# Patient Record
Sex: Male | Born: 1990 | Hispanic: Yes | Marital: Single | Smoking: Current some day smoker
Health system: Southern US, Community
[De-identification: ages and names within clinical notes are randomized; demographics above are authoritative.]

## PROBLEM LIST (undated history)

## (undated) DIAGNOSIS — K219 Gastro-esophageal reflux disease without esophagitis: Secondary | ICD-10-CM

## (undated) DIAGNOSIS — A048 Other specified bacterial intestinal infections: Secondary | ICD-10-CM

## (undated) HISTORY — PX: KNEE SURGERY: SHX244

---
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
## 2009-09-09 ENCOUNTER — Emergency Department: Payer: Self-pay | Admitting: Emergency Medicine

## 2012-09-17 ENCOUNTER — Emergency Department: Payer: Self-pay | Admitting: Emergency Medicine

## 2013-08-25 ENCOUNTER — Emergency Department: Payer: Self-pay

## 2013-08-25 LAB — URINALYSIS, COMPLETE
BACTERIA: NONE SEEN
BILIRUBIN, UR: NEGATIVE
Blood: NEGATIVE
GLUCOSE, UR: NEGATIVE mg/dL (ref 0–75)
Ketone: NEGATIVE
LEUKOCYTE ESTERASE: NEGATIVE
Nitrite: NEGATIVE
PH: 5 (ref 4.5–8.0)
Protein: NEGATIVE
RBC,UR: NONE SEEN /HPF (ref 0–5)
SPECIFIC GRAVITY: 1.025 (ref 1.003–1.030)
WBC UR: NONE SEEN /HPF (ref 0–5)

## 2013-08-25 LAB — CBC WITH DIFFERENTIAL/PLATELET
BASOS ABS: 0.1 10*3/uL (ref 0.0–0.1)
Basophil %: 0.8 %
EOS ABS: 0.3 10*3/uL (ref 0.0–0.7)
EOS PCT: 2.8 %
HCT: 45.5 % (ref 40.0–52.0)
HGB: 15.5 g/dL (ref 13.0–18.0)
LYMPHS ABS: 3.7 10*3/uL — AB (ref 1.0–3.6)
LYMPHS PCT: 39.7 %
MCH: 28.3 pg (ref 26.0–34.0)
MCHC: 34.1 g/dL (ref 32.0–36.0)
MCV: 83 fL (ref 80–100)
Monocyte #: 0.5 x10 3/mm (ref 0.2–1.0)
Monocyte %: 5.7 %
Neutrophil #: 4.7 10*3/uL (ref 1.4–6.5)
Neutrophil %: 51 %
PLATELETS: 271 10*3/uL (ref 150–440)
RBC: 5.47 10*6/uL (ref 4.40–5.90)
RDW: 14 % (ref 11.5–14.5)
WBC: 9.2 10*3/uL (ref 3.8–10.6)

## 2013-08-25 LAB — COMPREHENSIVE METABOLIC PANEL
ALT: 27 U/L (ref 12–78)
AST: 25 U/L (ref 15–37)
Albumin: 4.1 g/dL (ref 3.4–5.0)
Alkaline Phosphatase: 94 U/L
Anion Gap: 5 — ABNORMAL LOW (ref 7–16)
BUN: 13 mg/dL (ref 7–18)
Bilirubin,Total: 0.3 mg/dL (ref 0.2–1.0)
CHLORIDE: 106 mmol/L (ref 98–107)
Calcium, Total: 9.2 mg/dL (ref 8.5–10.1)
Co2: 28 mmol/L (ref 21–32)
Creatinine: 1.03 mg/dL (ref 0.60–1.30)
EGFR (Non-African Amer.): >60
GLUCOSE: 99 mg/dL (ref 65–99)
OSMOLALITY: 278 (ref 275–301)
POTASSIUM: 3.4 mmol/L — AB (ref 3.5–5.1)
SODIUM: 139 mmol/L (ref 136–145)
TOTAL PROTEIN: 8 g/dL (ref 6.4–8.2)

## 2013-08-25 LAB — LIPASE, BLOOD: LIPASE: 123 U/L (ref 73–393)

## 2013-08-26 ENCOUNTER — Emergency Department: Payer: Self-pay | Admitting: Emergency Medicine

## 2013-08-31 ENCOUNTER — Ambulatory Visit: Payer: Self-pay | Admitting: Physician Assistant

## 2013-08-31 LAB — AMYLASE: Amylase: 50 U/L (ref 25–115)

## 2013-10-30 ENCOUNTER — Emergency Department: Payer: Self-pay | Admitting: Emergency Medicine

## 2014-10-21 ENCOUNTER — Emergency Department
Admission: EM | Admit: 2014-10-21 | Discharge: 2014-10-21 | Disposition: A | Discharge status: Home / Self Care | Payer: Self-pay | Attending: Emergency Medicine | Admitting: Emergency Medicine

## 2014-10-21 ENCOUNTER — Encounter: Payer: Self-pay | Admitting: Emergency Medicine

## 2014-10-21 ENCOUNTER — Emergency Department: Payer: Self-pay

## 2014-10-21 DIAGNOSIS — Z720 Tobacco use: Secondary | ICD-10-CM | POA: Insufficient documentation

## 2014-10-21 DIAGNOSIS — Y9289 Other specified places as the place of occurrence of the external cause: Secondary | ICD-10-CM | POA: Insufficient documentation

## 2014-10-21 DIAGNOSIS — S82041A Displaced comminuted fracture of right patella, initial encounter for closed fracture: Secondary | ICD-10-CM | POA: Insufficient documentation

## 2014-10-21 DIAGNOSIS — Y9389 Activity, other specified: Secondary | ICD-10-CM | POA: Insufficient documentation

## 2014-10-21 DIAGNOSIS — S82001A Unspecified fracture of right patella, initial encounter for closed fracture: Secondary | ICD-10-CM

## 2014-10-21 DIAGNOSIS — S82002A Unspecified fracture of left patella, initial encounter for closed fracture: Secondary | ICD-10-CM | POA: Insufficient documentation

## 2014-10-21 DIAGNOSIS — W1789XA Other fall from one level to another, initial encounter: Secondary | ICD-10-CM | POA: Insufficient documentation

## 2014-10-21 DIAGNOSIS — Z23 Encounter for immunization: Secondary | ICD-10-CM | POA: Insufficient documentation

## 2014-10-21 DIAGNOSIS — Y998 Other external cause status: Secondary | ICD-10-CM | POA: Insufficient documentation

## 2014-10-21 MED ORDER — TETANUS-DIPHTH-ACELL PERTUSSIS 5-2.5-18.5 LF-MCG/0.5 IM SUSP
INTRAMUSCULAR | Status: AC
  Filled 2014-10-21: qty 0.5

## 2014-10-21 MED ORDER — TETANUS-DIPHTH-ACELL PERTUSSIS 5-2.5-18.5 LF-MCG/0.5 IM SUSP
0.5000 mL | Freq: Once | INTRAMUSCULAR | Status: AC
  Administered 2014-10-21: 0.5 mL via INTRAMUSCULAR

## 2014-10-21 MED ORDER — OXYCODONE-ACETAMINOPHEN 5-325 MG PO TABS: 1.0000 | ORAL_TABLET | ORAL | Status: AC | PRN

## 2014-10-21 NOTE — ED Provider Notes (Signed)
[facility] [place] Emergency Department Provider Note ____________________________________________  Time seen: Approximately 1:37 PM  I have reviewed the triage vital signs and the nursing notes.   HISTORY  Chief Complaint Fall   HPI Anthony Cobb is a 24 y.o. male is here with complaint of bilateral knee pain. He states he fell from the porch approximately 2 feet and asphalt when he lost his balance. He denies any head injury or loss of consciousness. He had some pain with walking. Right knee hurts worse than the left. He denies any previous injuries to his knees. He has not taken any pain medication for this. He last ate approximately 9 or 10:00, eating eggs and rice. At this time he states he does not need any pain medication. He does rate his pain 9 out of 10.   History reviewed. No pertinent past medical history.  There are no active problems to display for this patient.   History reviewed. No pertinent past surgical history.  Current Outpatient Rx  Name  Route  Sig  Dispense  Refill  . oxyCODONE-acetaminophen (PERCOCET) 5-325 MG per tablet   Oral   Take 1 tablet by mouth every 4 (four) hours as needed for severe pain.   20 tablet   0     Allergies Review of patient's allergies indicates no known allergies.  No family history on file.  Social History History  Substance Use Topics  . Smoking status: Current Some Day Smoker  . Smokeless tobacco: Not on file  . Alcohol Use: No    Review of Systems Constitutional: No fever/chills Eyes: No visual changes. Cardiovascular: Denies chest pain. Respiratory: Denies shortness of breath. Gastrointestinal: No abdominal pain.  No nausea, no vomiting. Genitourinary: Negative for dysuria. Musculoskeletal: Negative for back pain. Skin: Negative for rash. Neurological: Negative for headaches, focal weakness or numbness.  10-point ROS otherwise  negative.  ____________________________________________   PHYSICAL EXAM:  VITAL SIGNS: ED Triage Vitals  Enc Vitals Group     BP 10/21/14 1229 116/68 mmHg     Pulse Rate 10/21/14 1229 59     Resp 10/21/14 1229 18     Temp 10/21/14 1229 99.1 F (37.3 C)     Temp Source 10/21/14 1229 Oral     SpO2 10/21/14 1229 99 %     Weight 10/21/14 1229 220 lb (99.791 kg)     Height 10/21/14 1229 5\' 7"  (1.702 m)     Head Cir --      Peak Flow --      Pain Score 10/21/14 1230 9     Pain Loc --      Pain Edu? --      Excl. in GC? --     Constitutional: Alert and oriented. Well appearing and in no acute distress. Eyes: Conjunctivae are normal. PERRL. EOMI. Head: Atraumatic. Nose: No congestion/rhinnorhea. Neck: No stridor.  No cervical tenderness on palpation cervical spine. Cardiovascular: Normal rate, regular rhythm. Grossly normal heart sounds.  Good peripheral circulation. Respiratory: Normal respiratory effort.  No retractions. Lungs CTAB. Gastrointestinal: Soft and nontender. No distention. No abdominal bruits. No CVA tenderness. Musculoskeletal: Upper extremities move well without any restriction or discomfort. Bilateral knees anteriorly with mild effusion,  moderate tenderness on palpation. Range of motion bilateral lower extremities not tested secondary to patient's pain.  Neurologic:  Normal speech and language. No gross focal neurologic deficits are appreciated. Speech is normal. Gait was not tested secondary to pain Skin:  Skin is warm, dry and intact.  There is superficial abrasion to the right anterior knee without bleeding. Psychiatric: Mood and affect are normal. Speech and behavior are normal.  ____________________________________________   LABS (all labs ordered are listed, but only abnormal results are displayed)  Labs Reviewed - No data to display  RADIOLOGY  Right knee per radiologist and reviewed by me shows comminuted fracture mid patella, small joint effusion but  no dislocation. Left knee shows mildly displaced patellar fracture per radiologist and reviewed by me ____________________________________________   PROCEDURES  Procedure(s) performed: None  Critical Care performed: No  ____________________________________________   INITIAL IMPRESSION / ASSESSMENT AND PLAN / ED COURSE  Pertinent labs & imaging results that were available during my care of the patient were reviewed by me and considered in my medical decision making (see chart for details).  She was placed in bilateral knee immobilizers and crutches. He is to ice and elevate these the weekend he is given a prescription for pain medication and is told to follow-up with Dr. Hyacinth Meeker in the office. He was given the name of the orthopedist on call to make an appointment on Monday. ____________________________________________   FINAL CLINICAL IMPRESSION(S) / ED DIAGNOSES  Final diagnoses:  Patella fracture, left, closed, initial encounter  Patella fracture, right, closed, initial encounter      Tommi Rumps, PA-C 10/21/14 1607  Minna Antis, MD 10/23/14 [phone]

## 2014-10-21 NOTE — Discharge Instructions (Signed)
° °  CALL DR. MILLER'S OFFICE ON Monday FOR AN APPOINTMENT PERCOCET FOR PAIN AS NEEDED USE KNEE BRACE AND ICE PACKS FOR KNEES, USE CRUTCHES FOR WALKING

## 2014-10-21 NOTE — ED Notes (Signed)
States he fell from [employer] on both knees  Abrasion to left knee .swelling to both knees

## 2014-11-10 ENCOUNTER — Emergency Department
Admission: EM | Admit: 2014-11-10 | Discharge: 2014-11-10 | Disposition: A | Discharge status: Home / Self Care | Payer: Self-pay | Attending: Emergency Medicine | Admitting: Emergency Medicine

## 2014-11-10 ENCOUNTER — Encounter: Payer: Self-pay | Admitting: Urgent Care

## 2014-11-10 DIAGNOSIS — Y998 Other external cause status: Secondary | ICD-10-CM | POA: Insufficient documentation

## 2014-11-10 DIAGNOSIS — Z720 Tobacco use: Secondary | ICD-10-CM | POA: Insufficient documentation

## 2014-11-10 DIAGNOSIS — Y9289 Other specified places as the place of occurrence of the external cause: Secondary | ICD-10-CM | POA: Insufficient documentation

## 2014-11-10 DIAGNOSIS — Y9389 Activity, other specified: Secondary | ICD-10-CM | POA: Insufficient documentation

## 2014-11-10 DIAGNOSIS — Z0000 Encounter for general adult medical examination without abnormal findings: Secondary | ICD-10-CM

## 2014-11-10 DIAGNOSIS — X58XXXA Exposure to other specified factors, initial encounter: Secondary | ICD-10-CM | POA: Insufficient documentation

## 2014-11-10 DIAGNOSIS — F419 Anxiety disorder, unspecified: Secondary | ICD-10-CM | POA: Insufficient documentation

## 2014-11-10 HISTORY — DX: Other specified bacterial intestinal infections: A04.8

## 2014-11-10 NOTE — ED Notes (Signed)
States he took a Zantac this am by mistake.. Then felt funny  Felt like throat was opening and funny in chest. States he is feeling better at present

## 2014-11-10 NOTE — ED Provider Notes (Signed)
[facility] Emergency Department Provider Note  ____________________________________________  Time seen: 8:34 AM  I have reviewed the triage vital signs and the nursing notes.   HISTORY  Chief Complaint Knee Pain and Allergic Reaction    HPI Anthony Cobb is a 24 y.o. male is his morning with complaint of feeling funny. Patient states that he took a Zantac of his mothers by accident. Because he thought this is an allergic reaction he came straight to the emergency room however right now he is feeling much better and not having any symptoms. He denies any shortness of breath or rash. He denies any difficulty talking or swallowing. He was seen on 6/17 for bilateral patellar fractures and recently underwent surgery on the right knee at [facility].   Past Medical History  Diagnosis Date  . H. pylori infection     There are no active problems to display for this patient.   Past Surgical History  Procedure Laterality Date  . Knee surgery Right     Current Outpatient Rx  Name  Route  Sig  Dispense  Refill  . oxyCODONE-acetaminophen (PERCOCET) 5-325 MG per tablet   Oral   Take 1 tablet by mouth every 4 (four) hours as needed for severe pain.   20 tablet   0     Allergies Review of patient's allergies indicates no known allergies.  No family history on file.  Social History History  Substance Use Topics  . Smoking status: Current Some Day Smoker  . Smokeless tobacco: Not on file  . Alcohol Use: No    Review of Systems Constitutional: No fever/chills Eyes: No visual changes. ENT: No sore throat. Cardiovascular: Denies chest pain. Respiratory: Denies shortness of breath. Gastrointestinal: No abdominal pain.  No nausea, no vomiting. Genitourinary: Negative for dysuria. Musculoskeletal: Negative for back pain. Skin: Negative for rash. Neurological: Negative for headaches, focal weakness or numbness.  10-point ROS otherwise  negative.  ____________________________________________   PHYSICAL EXAM:  VITAL SIGNS: ED Triage Vitals  Enc Vitals Group     BP 11/10/14 0651 120/66 mmHg     Pulse Rate 11/10/14 0651 76     Resp 11/10/14 0651 18     Temp 11/10/14 0651 98.8 F (37.1 C)     Temp Source 11/10/14 0651 Oral     SpO2 11/10/14 0651 98 %     Weight 11/10/14 0651 220 lb (99.791 kg)     Height 11/10/14 0651  (1.727 m)     Head Cir --      Peak Flow --      Pain Score 11/10/14 0651 3     Pain Loc --      Pain Edu? --      Excl. in GC? --     Constitutional: Alert and oriented. Well appearing and in no acute distress. Eyes: Conjunctivae are normal. PERRL. EOMI. Head: Atraumatic. Nose: No congestion/rhinnorhea. Mouth/Throat: Mucous membranes are moist.  Oropharynx non-erythematous. No edema noted of the tongue or posterior pharynx. Patient has no difficulty swallowing and speaks in full sentences without difficulty Neck: No stridor.   Hematological/Lymphatic/Immunilogical: No cervical lymphadenopathy. Cardiovascular: Normal rate, regular rhythm. Grossly normal heart sounds.  Good peripheral circulation. Respiratory: Normal respiratory effort.  No retractions. Lungs CTAB. Gastrointestinal: Soft and nontender. No distention. No abdominal bruits. No CVA tenderness. Musculoskeletal: No lower extremity tenderness nor edema.  No joint effusions. Neurologic:  Normal speech and language. No gross focal neurologic deficits are appreciated. Speech is  normal. No gait instability. Skin:  Skin is warm, dry and intact. No rash noted. Psychiatric: Mood and affect are normal. Speech and behavior are normal.  ____________________________________________   LABS (all labs ordered are listed, but only abnormal results are displayed)  Labs Reviewed - No data to display  PROCEDURES  Procedure(s) performed: None  Critical Care performed: No  ____________________________________________   INITIAL  IMPRESSION / ASSESSMENT AND PLAN / ED COURSE  Pertinent labs & imaging results that were available during my care of the patient were reviewed by me and considered in my medical decision making (see chart for details).  Patient states that he believes it was more anxiety than actual reaction. He is having absolutely no difficulty now and feels completely fine. ____________________________________________   FINAL CLINICAL IMPRESSION(S) / ED DIAGNOSES  Final diagnoses:  Anxiety  Well adult exam      Tommi RumpsRhonda L Summers, PA-C 11/10/14 1313  Sharman CheekPhillip Stafford, MD 11/10/14 1535

## 2014-11-10 NOTE — Discharge Instructions (Signed)
FOLLOW UP WITH YOUR [facility]CTOR AT [facility] AS SCHEDULED

## 2014-11-10 NOTE — ED Notes (Addendum)
Patient presents with reports of allergic reaction symptoms s/p taking Zantac 150mg  at 0500. Patient also with reports of RIGHT knee pain - s/p surgery on Tuesday - has on-Q pain system in place; Ropivicaine infusion. Of note, patient with NAD noted; no respiratory distress or s/s of allergic reaction noted. In addition to the Zantac, patient took 1gm of APAP.

## 2015-05-03 ENCOUNTER — Emergency Department
Admission: EM | Admit: 2015-05-03 | Discharge: 2015-05-03 | Disposition: A | Discharge status: Home / Self Care | Payer: Self-pay | Attending: Emergency Medicine | Admitting: Emergency Medicine

## 2015-05-03 ENCOUNTER — Encounter: Payer: Self-pay | Admitting: *Deleted

## 2015-05-03 DIAGNOSIS — Y92009 Unspecified place in unspecified non-institutional (private) residence as the place of occurrence of the external cause: Secondary | ICD-10-CM | POA: Insufficient documentation

## 2015-05-03 DIAGNOSIS — F17210 Nicotine dependence, cigarettes, uncomplicated: Secondary | ICD-10-CM | POA: Insufficient documentation

## 2015-05-03 DIAGNOSIS — S81851A Open bite, right lower leg, initial encounter: Secondary | ICD-10-CM | POA: Insufficient documentation

## 2015-05-03 DIAGNOSIS — Y9389 Activity, other specified: Secondary | ICD-10-CM | POA: Insufficient documentation

## 2015-05-03 DIAGNOSIS — W540XXA Bitten by dog, initial encounter: Secondary | ICD-10-CM | POA: Insufficient documentation

## 2015-05-03 DIAGNOSIS — Y998 Other external cause status: Secondary | ICD-10-CM | POA: Insufficient documentation

## 2015-05-03 MED ORDER — AMOXICILLIN-POT CLAVULANATE 875-125 MG PO TABS: 1.0000 | ORAL_TABLET | Freq: Two times a day (BID) | ORAL | Status: AC

## 2015-05-03 NOTE — ED Notes (Signed)
Dog bite behind right knee, laceration noted, no bleeding noted

## 2015-05-03 NOTE — Discharge Instructions (Signed)

## 2015-05-03 NOTE — ED Provider Notes (Signed)
[facility] Emergency Department Provider Note ?  ? ____________________________________________ ? Time seen:  ? I have reviewed the triage vital signs and the nursing notes.  ________ HISTORY ? Chief Complaint Animal Bite     HPI  Anthony Cobb is a 24 y.o. male   who presents emergency department complaining of a dog bite to the posterior aspect of his right knee. He states he was over at a neighbors house working on a vehicle when the neighbor's dog bit him. He states that according to the owner of the dog is current on all of its vaccinations including rabies. Patient states his last tetanus shot was less than 3 years ago. Bleeding is controlled prior to arrival. Patient denies any numbness or tingling distal to the injury. Patient denies any loss of function to knee or distal right lower extremity. ? ? ? Past Medical History  Diagnosis Date  . H. pylori infection     There are no active problems to display for this patient.  ? Past Surgical History  Procedure Laterality Date  . Knee surgery Right    ? Current Outpatient Rx  Name  Route  Sig  Dispense  Refill  . amoxicillin-clavulanate (AUGMENTIN) 875-125 MG tablet   Oral   Take 1 tablet by mouth 2 (two) times daily.   14 tablet   0   . oxyCODONE-acetaminophen (PERCOCET) 5-325 MG per tablet   Oral   Take 1 tablet by mouth every 4 (four) hours as needed for severe pain.   20 tablet   0    ? Allergies Review of patient's allergies indicates no known allergies. ? No family history on file. ? Social History Social History  Substance Use Topics  . Smoking status: Current Some Day Smoker -- 1.00 packs/day    Types: Cigarettes  . Smokeless tobacco: None  . Alcohol Use: No   ? Review of Systems Constitutional: no fever. Eyes: no discharge ENT: no sore throat. Cardiovascular: no chest pain. Respiratory: no cough. No sob Gastrointestinal: denies abdominal pain, vomiting,  diarrhea, and constipation Genitourinary: no dysuria. Negative for hematuria Musculoskeletal: Negative for back pain. Skin: Negative for rash. endorses dog bite to posterior right knee.  Neurological: Negative for headaches  10-point ROS otherwise negative.  _______________ PHYSICAL EXAM: ? VITAL SIGNS:   ED Triage Vitals  Enc Vitals Group     BP 05/03/15 1858 132/82 mmHg     Pulse Rate 05/03/15 1858 78     Resp 05/03/15 1858 20     Temp 05/03/15 1858 99.6 F (37.6 C)     Temp Source 05/03/15 1858 Oral     SpO2 05/03/15 1858 98 %     Weight 05/03/15 1858 200 lb (90.719 kg)     Height 05/03/15 1858  (1.702 m)     Head Cir --      Peak Flow --      Pain Score 05/03/15 1859 2     Pain Loc --      Pain Edu? --      Excl. in GC? --    ?  Constitutional: Alert and oriented. Well appearing and in no distress. Eyes: Conjunctivae are normal.  ENT      Head: Normocephalic and atraumatic.      Ears:       Nose: No congestion/rhinnorhea.      Mouth/Throat: Mucous membranes are moist.       Hematological/Lymphatic/Immunilogical: No cervical lymphadenopathy. Cardiovascular: Normal rate, regular  rhythm.  Respiratory: Normal respiratory effort without tachypnea nor retractions. Gastrointestinal: Soft and nontender. No distention. There is no CVA tenderness. Genitourinary:  Musculoskeletal: Nontender with normal range of motion in all extremities.      Neurologic:  Normal speech and language. No gross focal neurologic deficits are appreciated. Skin:  Skin is warm, dry and intact. No rash noted. Psychiatric: Mood and affect are normal. Speech and behavior are normal. Patient exhibits appropriate insight and judgment.    ___________ RADIOLOGY    _____________ PROCEDURES ? Procedure(s) performed:     ______________________________________________________ INITIAL IMPRESSION / ASSESSMENT AND PLAN / ED COURSE ? Pertinent labs & imaging results that were available  during my care of the patient were reviewed by me and considered in my medical decision making (see chart for details).   Patient presents to the emergency department with a complaint of a dog bite to the posterior right knee. I advised patient that these wounds are not primary close in the emergency department. The patient will be placed on antibiotics and advised to keep area clean and covered. Patient declines rabies vaccines at this time. Patient verbalizes understanding of diagnosis and treatment plan and verbalizes compliance of same.    New Prescriptions   AMOXICILLIN-CLAVULANATE (AUGMENTIN) 875-125 MG TABLET    Take 1 tablet by mouth 2 (two) times daily.   ____________________________________________ FINAL CLINICAL IMPRESSION(S) / ED DIAGNOSES?  Final diagnoses:  Dog bite of lower leg, right, initial encounter       Racheal PatchesJonathan D Cuthriell, PA-C 05/03/15 2017  Loleta Roseory Forbach, MD 05/04/15 [phone]

## 2015-10-09 IMAGING — CR DG KNEE COMPLETE 4+V*L*
1 series · 4 of 4 positions shown · non-contrast
Comparison: None.

CLINICAL DATA: Acute left knee pain after fall from porch. Initial
encounter.

EXAM:
LEFT KNEE - COMPLETE 4+ VIEW

[Series 1: ap · 0.17mm/px · 4 of 4 slices shown]
[im 1/4]
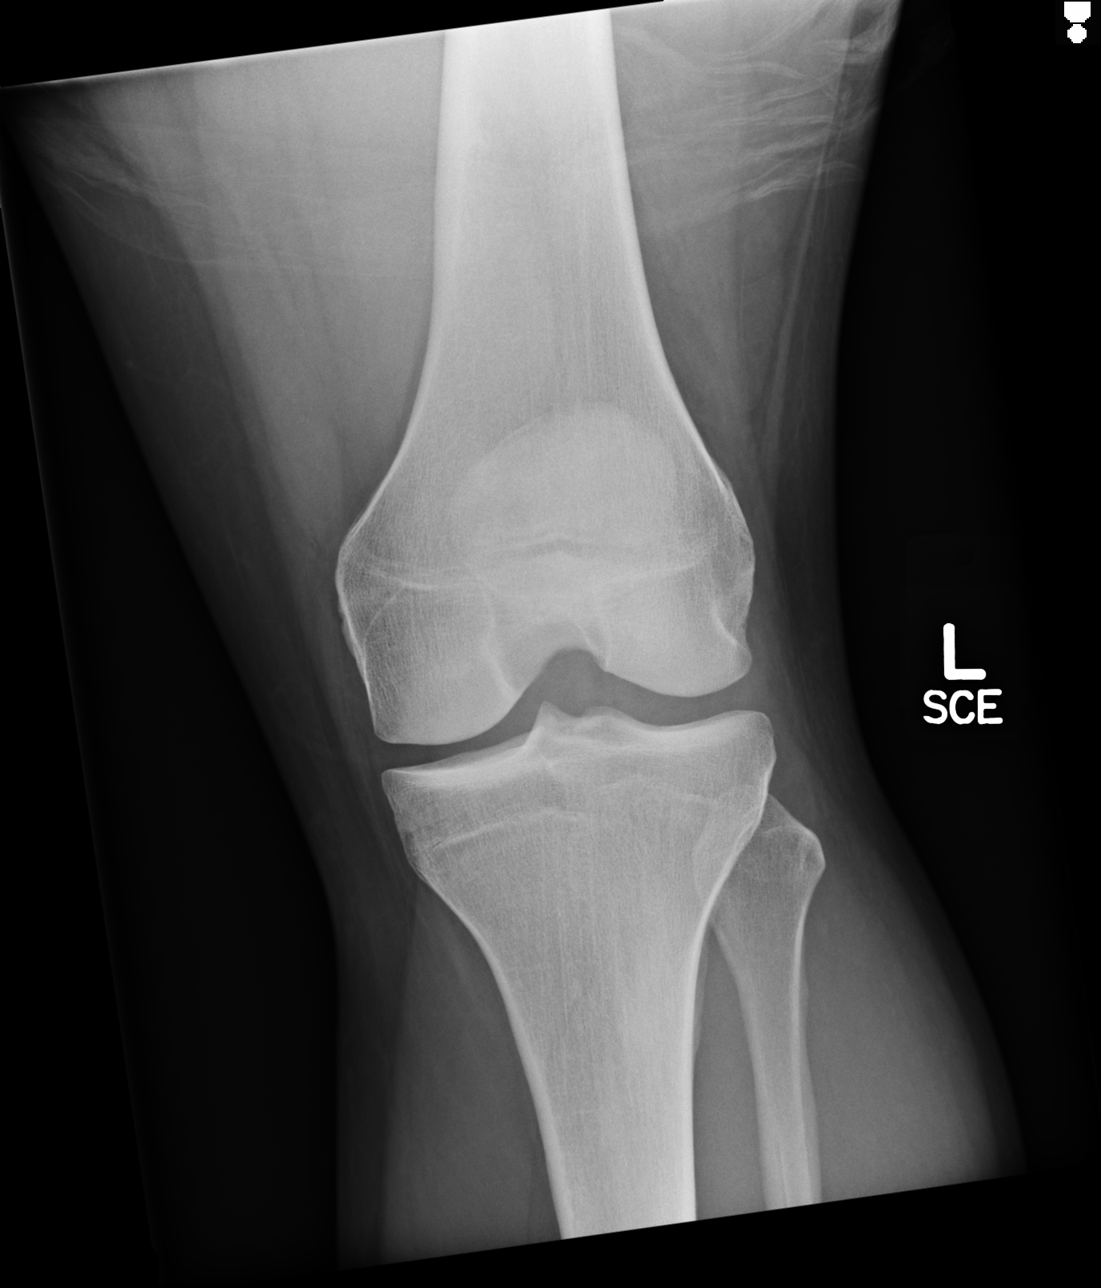
[im 2/4]
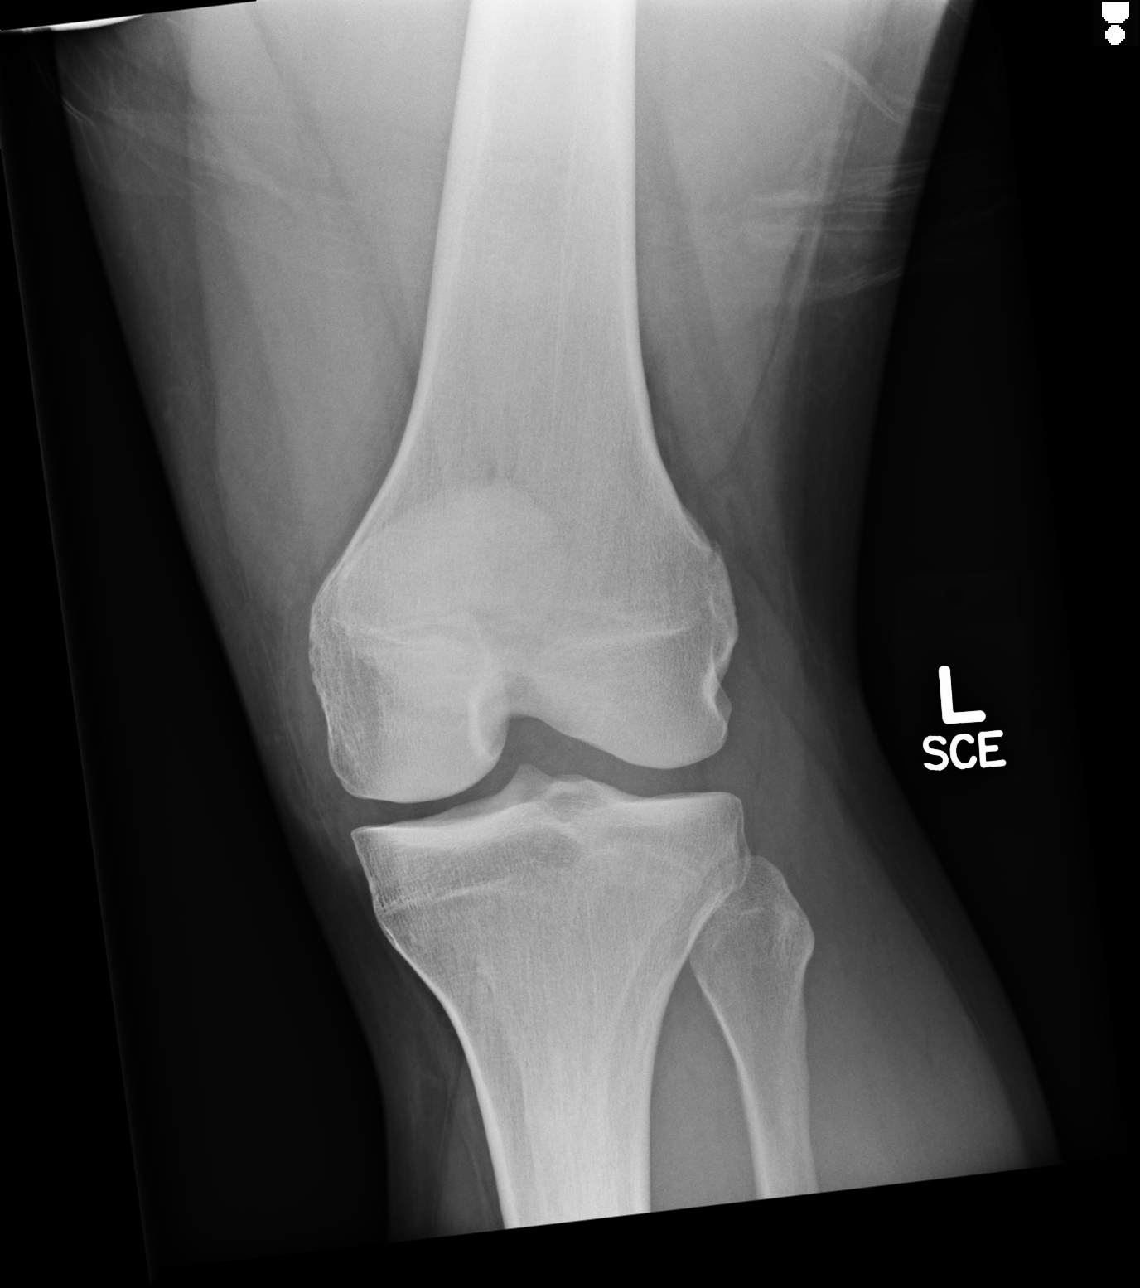
[im 3/4]
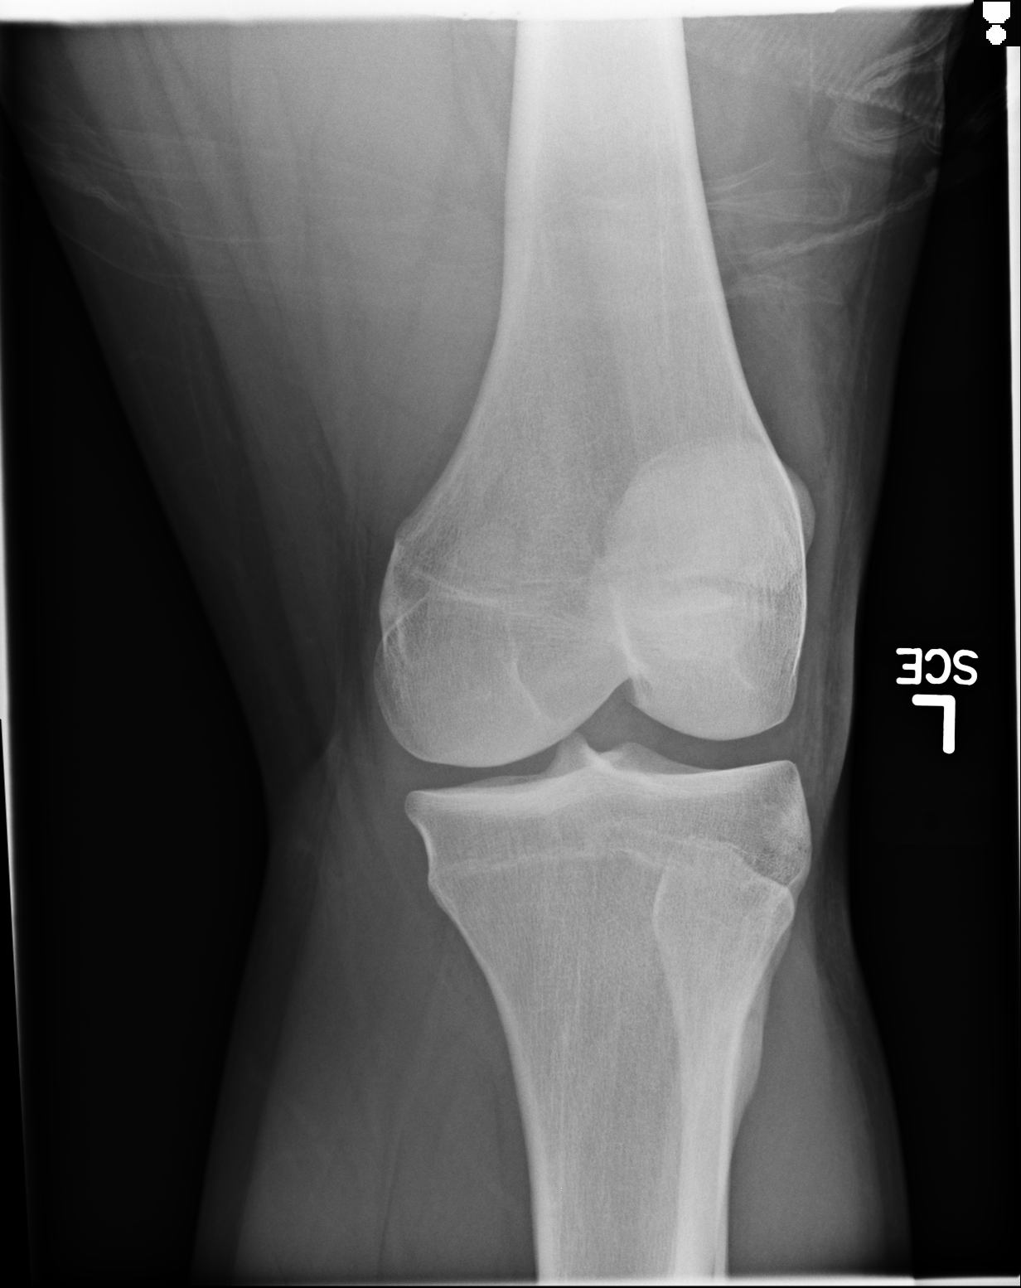
[im 4/4]
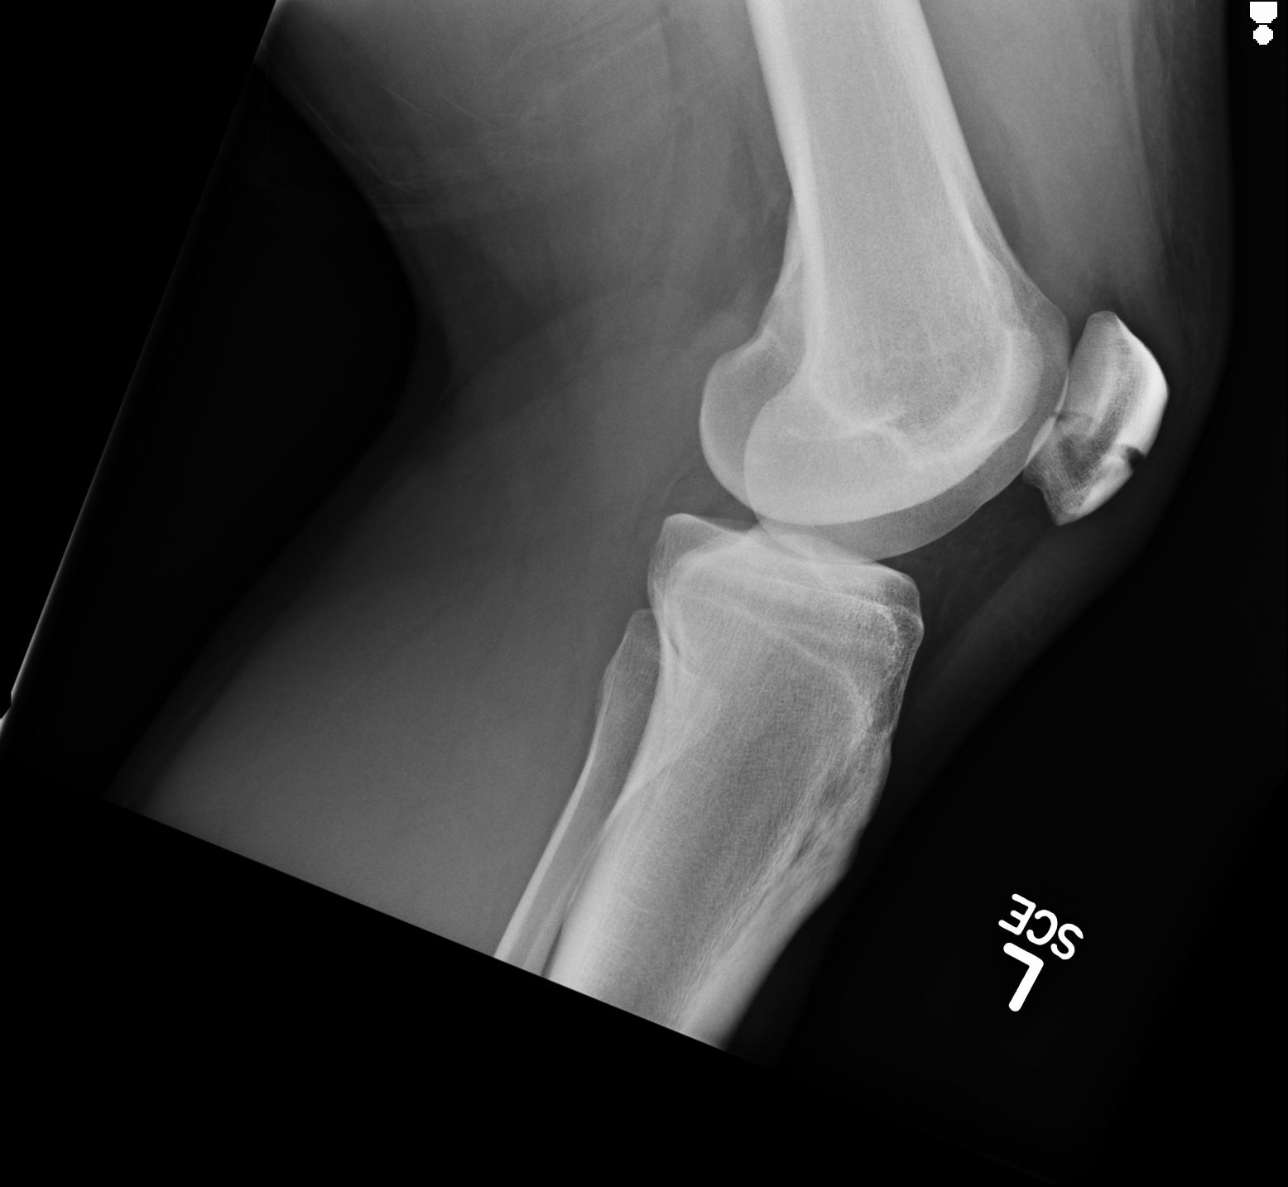

[4 of 4 positions shown; findings below may reference images not displayed]

FINDINGS: Mild suprapatellar joint effusion is noted. Mildly displaced
patellar fracture is noted. This appears to be closed and
posttraumatic. Visualized portions of distal femur and proximal
tibia appear normal. Joint spaces are intact.
IMPRESSION: Mildly displaced patellar fracture is noted.

## 2017-01-07 ENCOUNTER — Encounter: Payer: Self-pay | Admitting: Emergency Medicine

## 2017-01-07 ENCOUNTER — Emergency Department
Admission: EM | Admit: 2017-01-07 | Discharge: 2017-01-07 | Disposition: A | Discharge status: Home / Self Care | Payer: Self-pay | Attending: Emergency Medicine | Admitting: Emergency Medicine

## 2017-01-07 DIAGNOSIS — R42 Dizziness and giddiness: Secondary | ICD-10-CM | POA: Insufficient documentation

## 2017-01-07 DIAGNOSIS — Z5321 Procedure and treatment not carried out due to patient leaving prior to being seen by health care provider: Secondary | ICD-10-CM | POA: Insufficient documentation

## 2017-01-07 HISTORY — DX: Gastro-esophageal reflux disease without esophagitis: K21.9

## 2017-01-07 LAB — URINALYSIS, COMPLETE (UACMP) WITH MICROSCOPIC
BACTERIA UA: NONE SEEN
Bilirubin Urine: NEGATIVE
GLUCOSE, UA: NEGATIVE mg/dL
Hgb urine dipstick: NEGATIVE
Ketones, ur: NEGATIVE mg/dL
Leukocytes, UA: NEGATIVE
NITRITE: NEGATIVE
PROTEIN: NEGATIVE mg/dL
Specific Gravity, Urine: 1.027 (ref 1.005–1.030)
Squamous Epithelial / LPF: NONE SEEN
pH: 6 (ref 5.0–8.0)

## 2017-01-07 LAB — CBC
HEMATOCRIT: 43.2 % (ref 40.0–52.0)
Hemoglobin: 15.1 g/dL (ref 13.0–18.0)
MCH: 28.6 pg (ref 26.0–34.0)
MCHC: 35 g/dL (ref 32.0–36.0)
MCV: 81.6 fL (ref 80.0–100.0)
Platelets: 262 10*3/uL (ref 150–440)
RBC: 5.29 MIL/uL (ref 4.40–5.90)
RDW: 13.9 % (ref 11.5–14.5)
WBC: 9.2 10*3/uL (ref 3.8–10.6)

## 2017-01-07 LAB — COMPREHENSIVE METABOLIC PANEL
ALBUMIN: 4.3 g/dL (ref 3.5–5.0)
ALK PHOS: 66 U/L (ref 38–126)
ALT: 22 U/L (ref 17–63)
ANION GAP: 8 (ref 5–15)
AST: 24 U/L (ref 15–41)
BUN: 15 mg/dL (ref 6–20)
CALCIUM: 9.5 mg/dL (ref 8.9–10.3)
CHLORIDE: 106 mmol/L (ref 101–111)
CO2: 26 mmol/L (ref 22–32)
Creatinine, Ser: 0.92 mg/dL (ref 0.61–1.24)
GFR calc Af Amer: >60 mL/min (ref >60)
GFR calc non Af Amer: >60 mL/min (ref >60)
GLUCOSE: 95 mg/dL (ref 65–99)
POTASSIUM: 3.8 mmol/L (ref 3.5–5.1)
SODIUM: 140 mmol/L (ref 135–145)
Total Bilirubin: 0.4 mg/dL (ref 0.3–1.2)
Total Protein: 7.6 g/dL (ref 6.5–8.1)

## 2017-01-07 LAB — TROPONIN I

## 2017-01-07 LAB — GLUCOSE, CAPILLARY: Glucose-Capillary: 90 mg/dL (ref 65–99)

## 2017-01-07 NOTE — ED Triage Notes (Signed)
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 Pt is AOx4 in no apparent distress.

## 2017-01-08 ENCOUNTER — Telehealth: Payer: Self-pay | Admitting: Emergency Medicine

## 2017-01-08 NOTE — Telephone Encounter (Signed)
Called patient due to lwot to inquire about condition and follow up plans. He says this happens to him about once a year.  He also has some reflux and tries omeprazol.  He has no pcp or insurance. I explained [facility] and [facility] as options for his health care.  I also explained that he needs provider exam and that he can always return here

## 2017-10-19 ENCOUNTER — Other Ambulatory Visit: Payer: Self-pay

## 2017-10-19 ENCOUNTER — Encounter: Payer: Self-pay | Admitting: Emergency Medicine

## 2017-10-19 ENCOUNTER — Emergency Department
Admission: EM | Admit: 2017-10-19 | Discharge: 2017-10-19 | Disposition: A | Discharge status: Home / Self Care | Payer: Self-pay | Attending: Emergency Medicine | Admitting: Emergency Medicine

## 2017-10-19 DIAGNOSIS — T63441A Toxic effect of venom of bees, accidental (unintentional), initial encounter: Secondary | ICD-10-CM | POA: Insufficient documentation

## 2017-10-19 DIAGNOSIS — F17210 Nicotine dependence, cigarettes, uncomplicated: Secondary | ICD-10-CM | POA: Insufficient documentation

## 2017-10-19 DIAGNOSIS — Z79899 Other long term (current) drug therapy: Secondary | ICD-10-CM | POA: Insufficient documentation

## 2017-10-19 NOTE — ED Provider Notes (Signed)
[facility] Emergency Department Provider Note ____________________________________________  Time seen: 1745  I have reviewed the triage vital signs and the nursing notes.  HISTORY  Chief Complaint  Insect Bite (Bee Sting)  HPI Anthony Cobb is a 27 y.o. male presents to the ED accompanied by his son, for evaluation after a single wasp sting to the back of the neck.  Patient was outside when he got stung accidentally by a wasp approximately 35 to 45 minutes prior to arrival.  He denies any difficulty breathing, shortness of breath, or swelling of his lips, throat, or tongue.  He denies any previous anaphylactic reaction.  He reports some itching and dry bedside at the sting but denies any significant discomfort.  Past Medical History:  Diagnosis Date  . GERD (gastroesophageal reflux disease)   . H. pylori infection     There are no active problems to display for this patient.   Past Surgical History:  Procedure Laterality Date  . KNEE SURGERY Right     Prior to Admission medications   Medication Sig Start Date End Date Taking? Authorizing Provider  amoxicillin-clavulanate (AUGMENTIN) 875-125 MG tablet Take 1 tablet by mouth 2 (two) times daily. 05/03/15   Cuthriell, Delorise Royals, PA-C  oxyCODONE-acetaminophen (PERCOCET) 5-325 MG per tablet Take 1 tablet by mouth every 4 (four) hours as needed for severe pain. 10/21/14   Tommi Rumps, PA-C    Allergies Bee venom  No family history on file.  Social History Social History   Tobacco Use  . Smoking status: Current Some Day Smoker    Packs/day: 1.00    Types: Cigarettes  . Smokeless tobacco: Never Used  Substance Use Topics  . Alcohol use: No  . Drug use: Not on file    Review of Systems  Constitutional: Negative for fever. Eyes: Negative for visual changes. ENT: Negative for sore throat. Cardiovascular: Negative for chest pain. Respiratory: Negative for shortness of  breath. Gastrointestinal: Negative for abdominal pain, vomiting and diarrhea. Musculoskeletal: Negative for back pain. Skin: Negative for rash.  Wasp sting as above. Neurological: Negative for headaches, focal weakness or numbness. ____________________________________________  PHYSICAL EXAM:  VITAL SIGNS: ED Triage Vitals  Enc Vitals Group     BP 10/19/17 1723 (!) 112/92     Pulse Rate 10/19/17 1723 (!) 116     Resp 10/19/17 1723 18     Temp 10/19/17 1723 98.5 F (36.9 C)     Temp Source 10/19/17 1723 Oral     SpO2 10/19/17 1723 98 %     Weight 10/19/17 1720 240 lb (108.9 kg)     Height 10/19/17 1720 5\' 7"  (1.702 m)     Head Circumference --      Peak Flow --      Pain Score 10/19/17 1720 2     Pain Loc --      Pain Edu? --      Excl. in GC? --     Constitutional: Alert and oriented. Well appearing and in no distress. Head: Normocephalic and atraumatic. Eyes: Conjunctivae are normal. PERRL. Normal extraocular movements Ears: Canals clear. TMs intact bilaterally. Nose: No congestion/rhinorrhea/epistaxis. Mouth/Throat: Mucous membranes are moist.  Uvula is midline and tonsils are flat.  No oral lesions or edema is appreciated. Neck: Supple. No thyromegaly.  Patient with a single sting to the left posterior neck with surrounding erythema. Hematological/Lymphatic/Immunological: No cervical lymphadenopathy. Cardiovascular: Normal rate, regular rhythm. Normal distal pulses. Respiratory: Normal respiratory effort. No wheezes/rales/rhonchi. Musculoskeletal: Nontender  with normal range of motion in all extremities.  Neurologic:  Normal gait without ataxia. Normal speech and language. No gross focal neurologic deficits are appreciated. Skin:  Skin is warm, dry and intact. No rash noted. ____________________________________________  INITIAL IMPRESSION / ASSESSMENT AND PLAN / ED COURSE  Patient with ED evaluation of a wasp sting to the neck but accidental intent.  Patient without  any signs of anaphylaxis or systemic injury.  He has a local reaction consistent with a wasp sting.  He is advised to apply cool compresses to the area and he may take Benadryl over-the-counter for itch relief.  [facility].  Return precautions have been reviewed. ____________________________________________  FINAL CLINICAL IMPRESSION(S) / ED DIAGNOSES  Final diagnoses:  Bee sting, accidental or unintentional, initial encounter      Lissa HoardMenshew, Fatuma Dowers V Bacon, PA-C 10/19/17 1750    Arnaldo NatalMalinda, Paul F, MD 10/19/17 2030

## 2017-10-19 NOTE — ED Notes (Signed)
See triage note  Presents s/p bee sting  No resp distress but feels stiff and throbbing

## 2017-10-19 NOTE — ED Triage Notes (Signed)
Pt arrived via POV with reports of being stung by a wasp about 35 minutes prior to arrival when working on an old car.  Pt denies any difficulty breathing, or swelling of throat.  Pt denies any itching, pt c/o throbbing at the site of the sting which is at the backside of his neck on the left.   Pt states he has been stung in the past without any problems, but states he was stung by several yellow jackets and had hives and trouble swallowing, but states he is not having any problems right now.

## 2017-10-19 NOTE — Discharge Instructions (Addendum)
You have had a yellow jacket sting to the back of the neck. You do no appear to be having any serious allergic or anaphylactic reactions as this time. Apply ice and take OTC Benadryl for itch relief. Take Ibuprofen for pain relief. Return if needed.
